# Patient Record
Sex: Male | Born: 1990 | Race: Black or African American | Hispanic: No | Marital: Single | Smoking: Never smoker
Health system: Southern US, Community
[De-identification: ages and names within clinical notes are randomized; demographics above are authoritative.]

## PROBLEM LIST (undated history)

## (undated) DIAGNOSIS — G8929 Other chronic pain: Secondary | ICD-10-CM

## (undated) DIAGNOSIS — M79672 Pain in left foot: Secondary | ICD-10-CM

## (undated) HISTORY — DX: Pain in left foot: M79.672

## (undated) HISTORY — PX: CYST EXCISION: SHX5701

## (undated) HISTORY — DX: Other chronic pain: G89.29

---

## 2017-09-15 ENCOUNTER — Encounter (HOSPITAL_BASED_OUTPATIENT_CLINIC_OR_DEPARTMENT_OTHER): Payer: Self-pay | Admitting: Emergency Medicine

## 2017-09-15 ENCOUNTER — Emergency Department (HOSPITAL_BASED_OUTPATIENT_CLINIC_OR_DEPARTMENT_OTHER)
Admission: EM | Admit: 2017-09-15 | Discharge: 2017-09-15 | Disposition: A | Discharge status: Home / Self Care | Payer: Self-pay | Attending: Emergency Medicine | Admitting: Emergency Medicine

## 2017-09-15 ENCOUNTER — Other Ambulatory Visit: Payer: Self-pay

## 2017-09-15 ENCOUNTER — Emergency Department (HOSPITAL_BASED_OUTPATIENT_CLINIC_OR_DEPARTMENT_OTHER): Payer: Self-pay

## 2017-09-15 DIAGNOSIS — R2242 Localized swelling, mass and lump, left lower limb: Secondary | ICD-10-CM | POA: Insufficient documentation

## 2017-09-15 DIAGNOSIS — M722 Plantar fascial fibromatosis: Secondary | ICD-10-CM | POA: Insufficient documentation

## 2017-09-15 MED ORDER — IBUPROFEN 800 MG PO TABS: 800.0000 mg | ORAL_TABLET | Freq: Three times a day (TID) | ORAL | 0 refills | Status: DC | PRN

## 2017-09-15 NOTE — ED Notes (Signed)
Pt and mother given d/c instructions as per chart. Rx x 1. Verbalizes understanding. No questions.

## 2017-09-15 NOTE — ED Triage Notes (Signed)
Pt reports LT ankle swelling x few days; no known injury

## 2017-09-15 NOTE — Discharge Instructions (Signed)
Return here as needed follow-up with specialist provided.  Ice the foot.

## 2017-09-15 NOTE — ED Provider Notes (Signed)
[facility] EMERGENCY DEPARTMENT Provider Note   CSN: [id number] Arrival date & time: 09/15/17  1349     History   Chief Complaint Chief Complaint  Patient presents with  . Joint Swelling    HPI Darryl Martinez is a 26 y.o. male.  HPI Patient presents to the emergency department with left heel pain with swelling along the foot and ankle.  The patient states that this started about 1 week ago he states that he does stand a lot with his job.  The patient states he did not take any medications prior to arrival the patient states that movement and palpation makes the pain worse.  The patient states that nothing seems to make the condition better.  Patient denies numbness weakness rash fever or leg swelling or syncope. History reviewed. No pertinent past medical history.  There are no active problems to display for this patient.   Past Surgical History:  Procedure Laterality Date  . CYST EXCISION         Home Medications    Prior to Admission medications   Not on File    Family History No family history on file.  Social History Social History   Tobacco Use  . Smoking status: Never Smoker  . Smokeless tobacco: Never Used  Substance Use Topics  . Alcohol use: Yes  . Drug use: Not on file     Allergies   Patient has no known allergies.   Review of Systems Review of Systems  All other systems negative except as documented in the HPI. All pertinent positives and negatives as reviewed in the HPI. Physical Exam Updated Vital Signs BP (!) 142/100 (BP Location: Left Arm)   Pulse 90   Temp 98.8 F (37.1 C) (Oral)   Resp 18   Ht 5\' 5"  (1.651 m)   Wt 90.7 kg (200 lb)   SpO2 100%   BMI 33.28 kg/m   Physical Exam  Constitutional: He is oriented to person, place, and time. He appears well-developed and well-nourished. No distress.  HENT:  Head: Normocephalic and atraumatic.  Eyes: Pupils are equal, round, and reactive to light.    Pulmonary/Chest: Effort normal.  Musculoskeletal:       Feet:  Neurological: He is alert and oriented to person, place, and time.  Skin: Skin is warm and dry.  Psychiatric: He has a normal mood and affect.  Nursing note and vitals reviewed.    ED Treatments / Results  Labs (all labs ordered are listed, but only abnormal results are displayed) Labs Reviewed - No data to display  EKG  EKG Interpretation None       Radiology Dg Foot Complete Left  Result Date: 09/15/2017 CLINICAL DATA:  Swelling and pain in left heel radiating to the ankle. Wore wrong shoes to work 1 week ago. Initial encounter. EXAM: LEFT FOOT - COMPLETE 3+ VIEW COMPARISON:  None. FINDINGS: There is no evidence of fracture or dislocation. There is no evidence of arthropathy or other focal bone abnormality. Soft tissues are unremarkable. IMPRESSION: Negative. Electronically Signed   By: Marnee SpringJonathon  Watts M.D.   On: 09/15/2017 15:00    Procedures Procedures (including critical care time)  Medications Ordered in ED Medications - No data to display   Initial Impression / Assessment and Plan / ED Course  I have reviewed the triage vital signs and the nursing notes.  Pertinent labs & imaging results that were available during my care of the patient were reviewed by me and  considered in my medical decision making (see chart for details).     Patient will be treated for plantar fasciitis referred to podiatry.  Patient is advised to do stretching in the morning along with ice told to stay off of his foot as much as possible.  Final Clinical Impressions(s) / ED Diagnoses   Final diagnoses:  None    ED Discharge Orders    None       Charlestine NightLawyer, Seger Jani, PA-C 09/15/17 1534    Shaune PollackIsaacs, Cameron, MD 09/16/17 [phone]

## 2018-03-31 ENCOUNTER — Ambulatory Visit (INDEPENDENT_AMBULATORY_CARE_PROVIDER_SITE_OTHER): Payer: Self-pay | Admitting: Medical

## 2018-03-31 ENCOUNTER — Encounter: Payer: Self-pay | Admitting: Medical

## 2018-03-31 ENCOUNTER — Other Ambulatory Visit (HOSPITAL_COMMUNITY)
Admission: RE | Admit: 2018-03-31 | Discharge: 2018-03-31 | Disposition: A | Discharge status: Home / Self Care | Payer: Self-pay | Source: Ambulatory Visit | Attending: Medical | Admitting: Medical

## 2018-03-31 VITALS — BP 147/93 | HR 71 | Temp 99.2°F | Resp 16 | Ht 64.7 in | Wt 219.2 lb

## 2018-03-31 DIAGNOSIS — R0981 Nasal congestion: Secondary | ICD-10-CM

## 2018-03-31 DIAGNOSIS — Z113 Encounter for screening for infections with a predominantly sexual mode of transmission: Secondary | ICD-10-CM

## 2018-03-31 DIAGNOSIS — R300 Dysuria: Secondary | ICD-10-CM

## 2018-03-31 DIAGNOSIS — R0982 Postnasal drip: Secondary | ICD-10-CM

## 2018-03-31 MED ORDER — DOXYCYCLINE HYCLATE 100 MG PO TABS: 100.0000 mg | ORAL_TABLET | Freq: Two times a day (BID) | ORAL | 0 refills | Status: DC

## 2018-03-31 MED ORDER — LEVOCETIRIZINE DIHYDROCHLORIDE 5 MG PO TABS: 5.0000 mg | ORAL_TABLET | Freq: Every evening | ORAL | 0 refills | Status: DC

## 2018-03-31 MED ORDER — FLUTICASONE PROPIONATE 50 MCG/ACT NA SUSP: 2.0000 | Freq: Every day | NASAL | 1 refills | Status: DC

## 2018-03-31 NOTE — Progress Notes (Signed)
Subjective:    Patient ID: Darryl Martinez, male    DOB: 09-02-1991, 27 y.o.   MRN: [id number]  HPI  Pt in for first time.   Here to establish.  Pt member of [employer]. Has 2005 [employer].  Pt cuts hair/Barber. Pt does push ups and curls, Admits needs to eat better. Drinks alcohol on weeks 1-2 beers. Smokes some marijuana about one joint. Single.  Pt states about 2 weeks ago had unprotected sex and he wants to make sure no std. Pt states some mild pain on urination. States maybe discharge but he is not sure. Pt is circumsised.  Pt states one week of nasal congestion and pnd. No fever, no chills, no sweats or sinus pain. Some pnd.   Review of Systems  Constitutional: Negative for chills, fatigue and fever.  Respiratory: Negative for cough, chest tightness, shortness of breath and wheezing.   Cardiovascular: Negative for chest pain and palpitations.  Genitourinary: Positive for dysuria. Negative for decreased urine volume, penile pain, testicular pain and urgency.  Musculoskeletal: Negative for back pain, joint swelling and myalgias.  Neurological: Negative for seizures, facial asymmetry, speech difficulty, weakness and headaches.  Hematological: Negative for adenopathy. Does not bruise/bleed easily.  Psychiatric/Behavioral: Negative for behavioral problems and confusion.    Past Medical History:  Diagnosis Date  . Heel pain, chronic, left      Social History   Socioeconomic History  . Marital status: Single    Spouse name: Not on file  . Number of children: Not on file  . Years of education: Not on file  . Highest education level: Not on file  Occupational History  . Occupation: Paediatric nurse  Social Needs  . Financial resource strain: Not hard at all  . Food insecurity:    Worry: Never true    Inability: Never true  . Transportation needs:    Medical: No    Non-medical: No  Tobacco Use  . Smoking status: Never Smoker  . Smokeless tobacco: Never Used  Substance  and Sexual Activity  . Alcohol use: Yes    Comment: once a month  . Drug use: Yes    Types: Marijuana    Comment: "sometimes"  . Sexual activity: Yes    Partners: Female  Lifestyle  . Physical activity:    Days per week: 2 days    Minutes per session: 20 min  . Stress: To some extent  Relationships  . Social connections:    Talks on phone: More than three times a week    Gets together: Twice a week    Attends religious service: Never    Active member of club or organization: Yes    Attends meetings of clubs or organizations: More than 4 times per year    Relationship status: Not on file  . Intimate partner violence:    Fear of current or ex partner: Not on file    Emotionally abused: Not on file    Physically abused: Not on file    Forced sexual activity: Not on file  Other Topics Concern  . Not on file  Social History Narrative  . Not on file    Past Surgical History:  Procedure Laterality Date  . CYST EXCISION      No family history on file.  No Known Allergies  Current Outpatient Medications on File Prior to Visit  Medication Sig Dispense Refill  . ibuprofen (ADVIL,MOTRIN) 800 MG tablet Take 1 tablet (800 mg total) every 8 (eight) hours  as needed by mouth. 30 tablet 0   No current facility-administered medications on file prior to visit.     BP (!) 147/93 (BP Location: Left Arm, Patient Position: Sitting, Cuff Size: Large)   Pulse 71   Temp 99.2 F (37.3 C) (Oral)   Resp 16   Ht 5' 4.7" (1.643 m)   Wt 219 lb 3.2 oz (99.4 kg)   SpO2 100%   BMI 36.82 kg/m       Objective:   Physical Exam  General  Mental Status - Alert. General Appearance - Well groomed. Not in acute distress.  Skin Rashes- No Rashes.  HEENT Head- Normal. Ear Auditory Canal - Left- Normal. Right - Normal.Tympanic Membrane- Left- Normal. Right- Normal. Eye Sclera/Conjunctiva- Left- Normal. Right- Normal. Nose & Sinuses Nasal Mucosa- Left-  Boggy and Congested. Right-  Boggy  and  Congested.Bilateral no maxillary and no  rontal sinus pressure. Mouth & Throat Lips: Upper Lip- Normal: no dryness, cracking, pallor, cyanosis, or vesicular eruption. Lower Lip-Normal: no dryness, cracking, pallor, cyanosis or vesicular eruption. Buccal Mucosa- Bilateral- No Aphthous ulcers. Oropharynx- No Discharge or Erythema. +pnd. Tonsils: Characteristics- Bilateral- No Erythema or Congestion. Size/Enlargement- Bilateral- No enlargement. Discharge- bilateral-None.  Neck Neck- Supple. No Masses.   Chest and Lung Exam Auscultation: Breath Sounds:-Clear even and unlabored.  Cardiovascular Auscultation:Rythm- Regular, rate and rhythm. Murmurs & Other Heart Sounds:Ausculatation of the heart reveal- No Murmurs.  Lymphatic Head & Neck General Head & Neck Lymphatics: Bilateral: Description- No Localized lymphadenopathy.  Genital exam- deferred.      Assessment & Plan:  For STD screening, I placed orders for HIV, RPR, herpes type I & type II antibody testing and urine ancillary studies.  Since you do have some dysuria and symptoms occur around the time of unprotected sex, I decided to prescribe you doxycycline antibiotic.  For probable allergic rhinitis symptoms represented by nasal congestion and postnasal drainage, I prescribed Flonase and Xyzal.  Please remember those are over-the-counter.  Follow-up in 7 to 10 days or as needed.  Reminded pt on his concrn for std that hiv could repeat 3-6 months later for his reassurance as he does express concern. Explained how antibody test work.  Esperanza RichtersEdward Roniel Halloran, PA-C

## 2018-03-31 NOTE — Patient Instructions (Addendum)
For STD screening, I placed orders for HIV, RPR, herpes type I & type II antibody testing and urine ancillary studies.  Since you do have some dysuria and symptoms occur around the time of unprotected sex, I decided to prescribe you doxycycline antibiotic.   For probable allergic rhinitis symptoms represented by nasal congestion and postnasal drainage, I prescribed Flonase and Xyzal.  Please remember those are over-the-counter.  Follow-up in 7 to 10 days or as needed.

## 2018-04-01 ENCOUNTER — Telehealth: Payer: Self-pay | Admitting: Medical

## 2018-04-01 MED ORDER — FAMCICLOVIR 500 MG PO TABS: 500.0000 mg | ORAL_TABLET | Freq: Three times a day (TID) | ORAL | 0 refills | Status: DC

## 2018-04-01 NOTE — Telephone Encounter (Signed)
Rx famvir sent to pt pharmacy. 

## 2018-04-02 ENCOUNTER — Telehealth: Payer: Self-pay

## 2018-04-02 LAB — URINE CYTOLOGY ANCILLARY ONLY
Chlamydia: NEGATIVE
NEISSERIA GONORRHEA: POSITIVE — AB
TRICH (WINDOWPATH): NEGATIVE

## 2018-04-02 NOTE — Telephone Encounter (Signed)
Author phoned pt. 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 Pt. confirms that he has sores on his penis at this time, and stated he would pick up his prescription at the [facility] in [place] today.

## 2018-04-03 ENCOUNTER — Ambulatory Visit (INDEPENDENT_AMBULATORY_CARE_PROVIDER_SITE_OTHER): Payer: Self-pay | Admitting: Medical

## 2018-04-03 ENCOUNTER — Encounter: Payer: Self-pay | Admitting: Medical

## 2018-04-03 ENCOUNTER — Telehealth: Payer: Self-pay

## 2018-04-03 VITALS — BP 150/90 | HR 80 | Temp 98.2°F | Resp 18 | Ht 65.0 in | Wt 222.0 lb

## 2018-04-03 DIAGNOSIS — B009 Herpesviral infection, unspecified: Secondary | ICD-10-CM

## 2018-04-03 DIAGNOSIS — A549 Gonococcal infection, unspecified: Secondary | ICD-10-CM

## 2018-04-03 LAB — RPR: RPR Ser Ql: NONREACTIVE

## 2018-04-03 LAB — HSV 1/2 AB (IGM), IFA W/RFLX TITER
HSV 1 IgM Screen: NEGATIVE
HSV 2 IGM SCREEN: NEGATIVE

## 2018-04-03 LAB — HSV(HERPES SIMPLEX VRS) I + II AB-IGG: HSV 2 IGG, TYPE SPEC: 14.5 {index} — AB

## 2018-04-03 LAB — HIV ANTIBODY (ROUTINE TESTING W REFLEX): HIV 1&2 Ab, 4th Generation: NONREACTIVE

## 2018-04-03 MED ORDER — CEFTRIAXONE SODIUM 1 G IJ SOLR
1.0000 g | Freq: Once | INTRAMUSCULAR | Status: AC
  Administered 2018-04-03: 1 g via INTRAMUSCULAR

## 2018-04-03 NOTE — Telephone Encounter (Signed)
Author phoned pt. to notify him of positive gonnorrhea, and to request pt. come into the office for treatment per Esperanza RichtersEdward Saguier, PA. Appointment made for 6/6 at 0940 today.

## 2018-04-03 NOTE — Patient Instructions (Signed)
For your recent positive gonorrhea urine study, we gave you Rocephin IM in the office.  Your chlamydia test came back negative so you can stop doxycycline.  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  No recent/current outbreak.  I did go ahead and send Famvir to your pharmacy to take early on in the event early flare.  Also we discussed preventative use of antiviral.  You declined this today due to the fact that you describe rare outbreak.  You already notified recent sexual contact.  She is aware that she should be treated.  Follow-up as needed.

## 2018-04-03 NOTE — Progress Notes (Signed)
Pt here for STD treatment and education. Pt c/o genital sores and pain. Last sexual contact two weeks ago, one partner per pt. report. Author advised pt. To notify partner so she can be treated. Edward to assess and educate.  Rocephin 1G given, pt. Tolerated well.

## 2018-04-03 NOTE — Progress Notes (Signed)
Subjective:    Patient ID: Darryl Martinez, male    DOB: [birth year], 27 y.o.   MRN: [id number]  HPI  Pt in for follow up. He had +hsv igG antibodies. No recent outbreak. Does not report chronic outbreaks in past.   Also he had + urine test for gonorrhea.   Recent sex with partner 2 weeks ago. See last visit.   Review of Systems  Constitutional: Negative for chills, fatigue and fever.  Respiratory: Negative for cough, chest tightness and wheezing.   Cardiovascular: Negative for chest pain and palpitations.  Gastrointestinal: Negative for abdominal pain.  Genitourinary: Negative for dysuria, flank pain, frequency, genital sores, hematuria, penile pain, scrotal swelling, testicular pain and urgency.       See hpi. Mild dysuria.  Musculoskeletal: Negative for back pain.  Skin: Negative for rash.  Neurological: Negative for dizziness, speech difficulty, numbness and headaches.  Hematological: Negative for adenopathy. Does not bruise/bleed easily.  Psychiatric/Behavioral: Negative for behavioral problems and confusion. The patient is not nervous/anxious.    Past Medical History:  Diagnosis Date  . Heel pain, chronic, left      Social History   Socioeconomic History  . Marital status: Single    Spouse name: Not on file  . Number of children: Not on file  . Years of education: Not on file  . Highest education level: Not on file  Occupational History  . Occupation: Paediatric nursebarber  Social Needs  . Financial resource strain: Not hard at all  . Food insecurity:    Worry: Never true    Inability: Never true  . Transportation needs:    Medical: No    Non-medical: No  Tobacco Use  . Smoking status: Never Smoker  . Smokeless tobacco: Never Used  Substance and Sexual Activity  . Alcohol use: Yes    Comment: once a month  . Drug use: Yes    Types: Marijuana    Comment: "sometimes"  . Sexual activity: Yes    Partners: Female  Lifestyle  . Physical activity:    Days per week:  2 days    Minutes per session: 20 min  . Stress: To some extent  Relationships  . Social connections:    Talks on phone: More than three times a week    Gets together: Twice a week    Attends religious service: Never    Active member of club or organization: Yes    Attends meetings of clubs or organizations: More than 4 times per year    Relationship status: Not on file  . Intimate partner violence:    Fear of current or ex partner: Not on file    Emotionally abused: Not on file    Physically abused: Not on file    Forced sexual activity: Not on file  Other Topics Concern  . Not on file  Social History Narrative  . Not on file    Past Surgical History:  Procedure Laterality Date  . CYST EXCISION      No family history on file.  No Known Allergies  No current outpatient medications on file prior to visit.   No current facility-administered medications on file prior to visit.     BP (!) 150/90 (BP Location: Left Arm, Patient Position: Sitting, Cuff Size: Large)   Pulse 80   Temp 98.2 F (36.8 C) (Oral)   Resp 18   Ht 5\' 5"  (1.651 m)   Wt 222 lb (100.7 kg)   SpO2 97%  BMI 36.94 kg/m       Objective:   Physical Exam   General- No acute distress. Pleasant patient. Neck- Full range of motion, no jvd Lungs- Clear, even and unlabored. Heart- regular rate and rhythm. Neurologic- CNII- XII grossly intact.  Genital exam- deferred today.     Assessment & Plan:  For your recent positive gonorrhea urine study, we gave you Rocephin IM in the office.  Your chlamydia test came back negative so you can stop doxycycline.  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  No recent/current outbreak.  I did go ahead and send Famvir to your pharmacy to take early on in the event early flare.  Also we discussed preventative use of antiviral.  You declined this today due to the fact that you describe rare outbreak.  You already notified recent sexual contact.  She is  aware that she should be treated.  Follow-up as needed.  Esperanza Richters, PA-C

## 2018-07-02 IMAGING — CR DG FOOT COMPLETE 3+V*L*
3 series · 3 of 3 positions shown · non-contrast
Comparison: None.

CLINICAL DATA: Swelling and pain in left heel radiating to the
ankle. Wore wrong shoes to work 1 week ago. Initial encounter.

EXAM:
LEFT FOOT - COMPLETE 3+ VIEW

[t foot ap left]
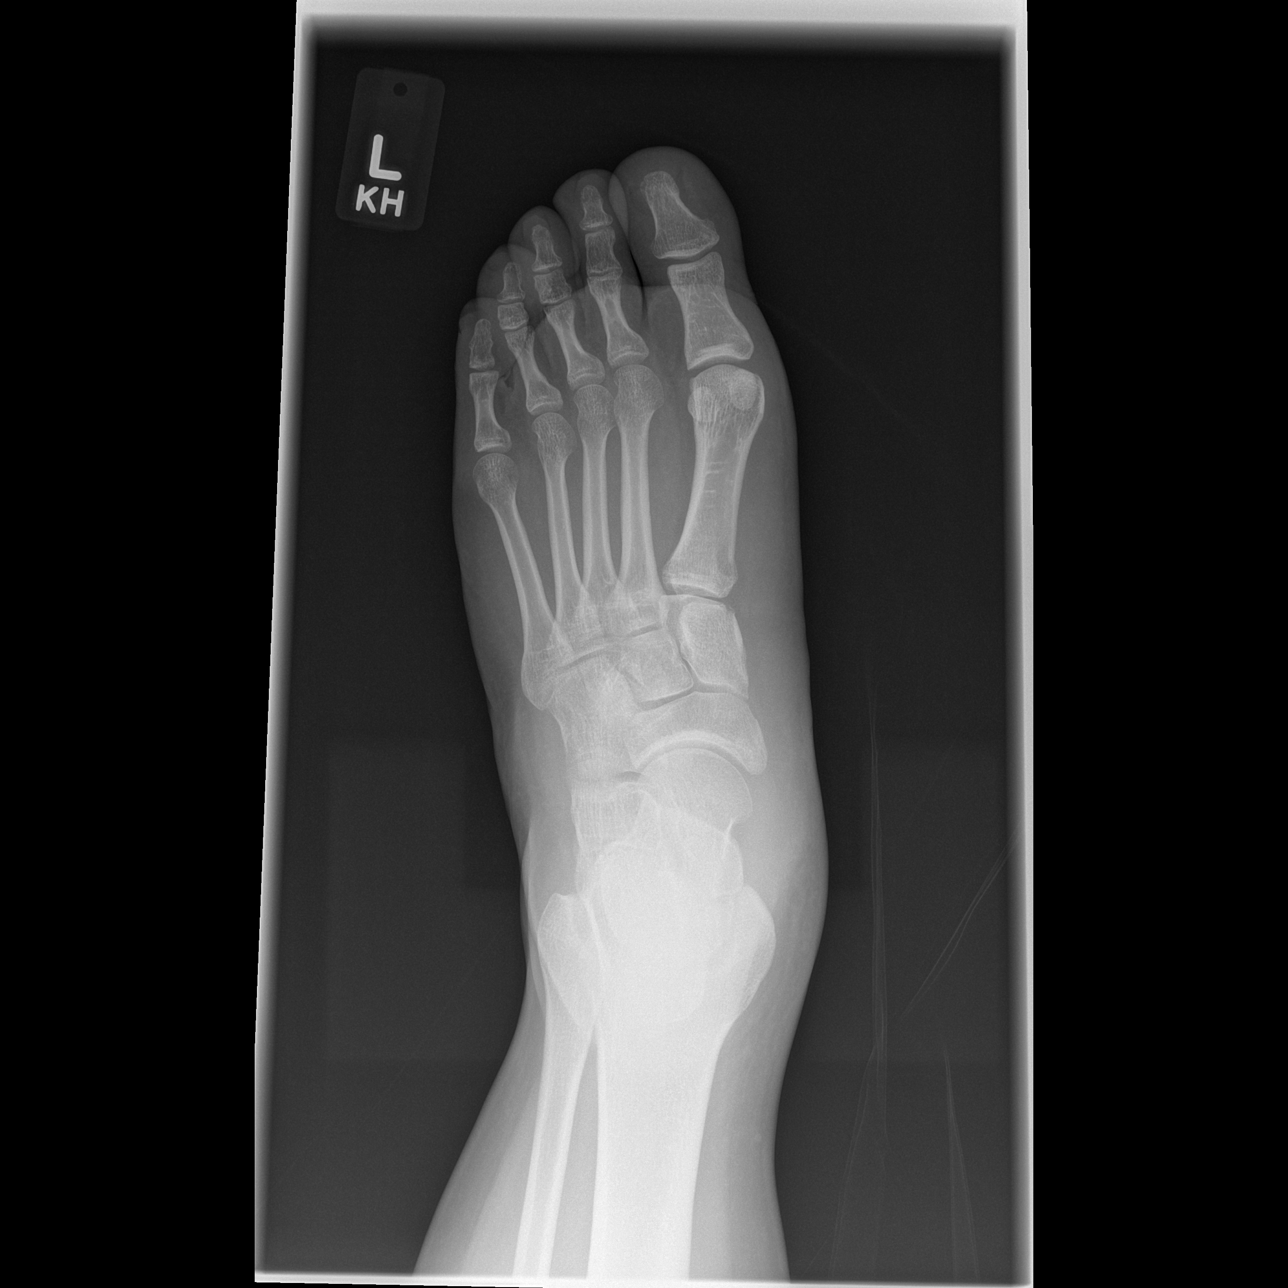

[t foot oblique left]
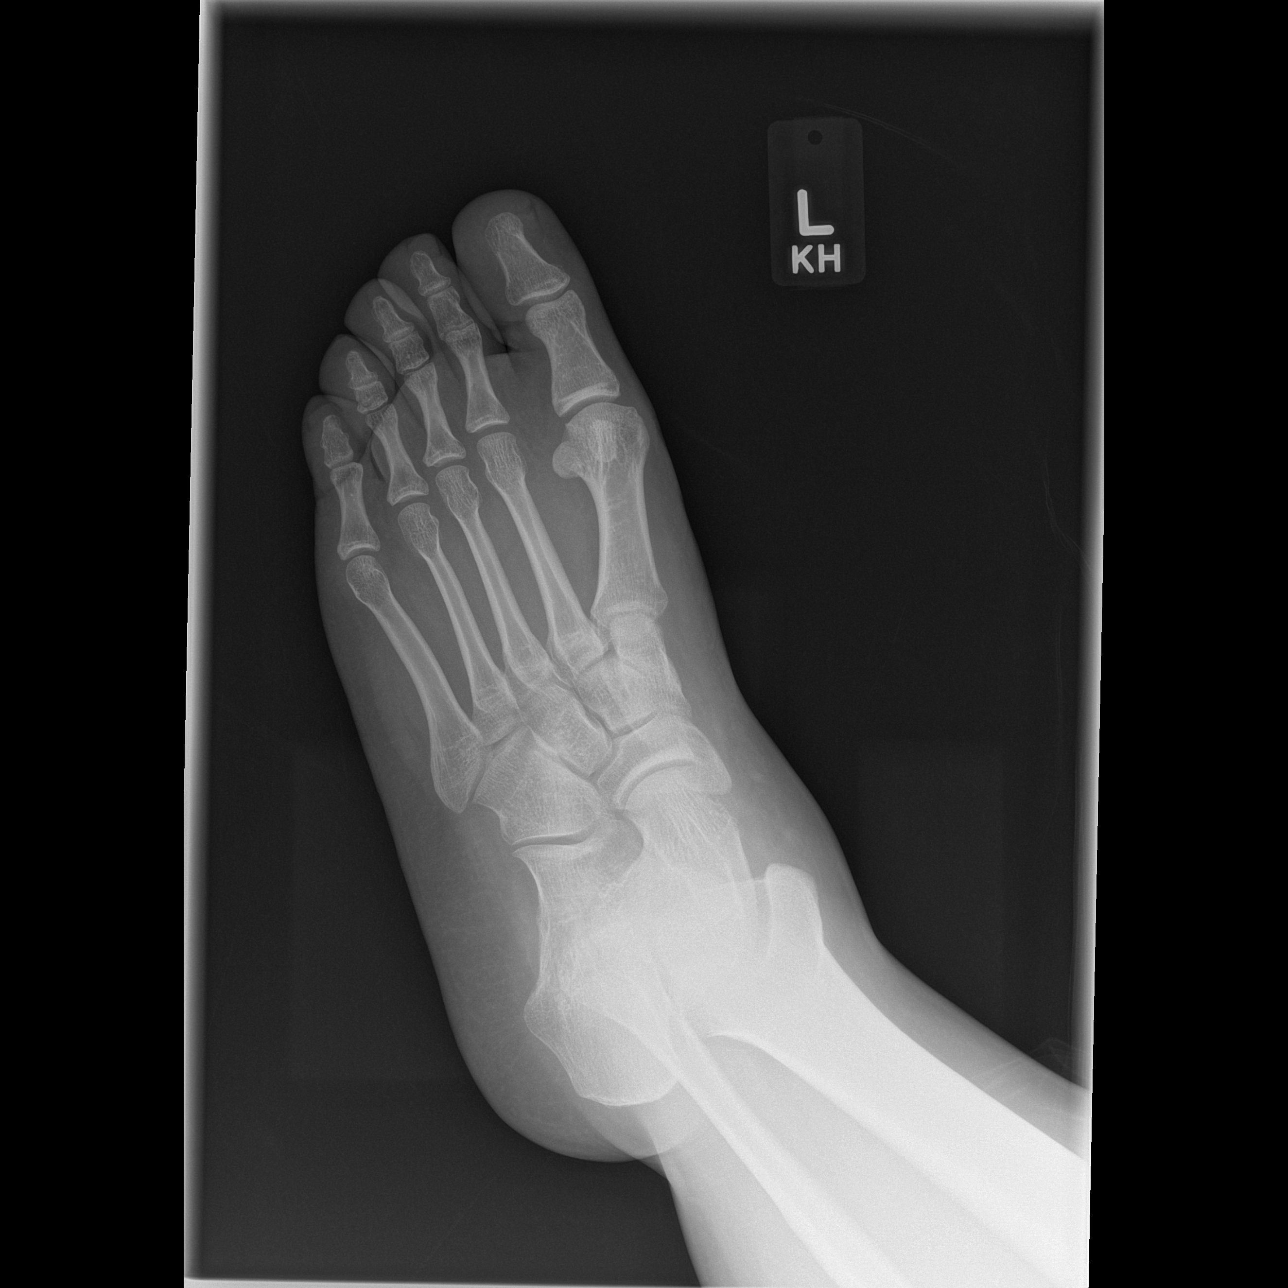

[t foot lat left]
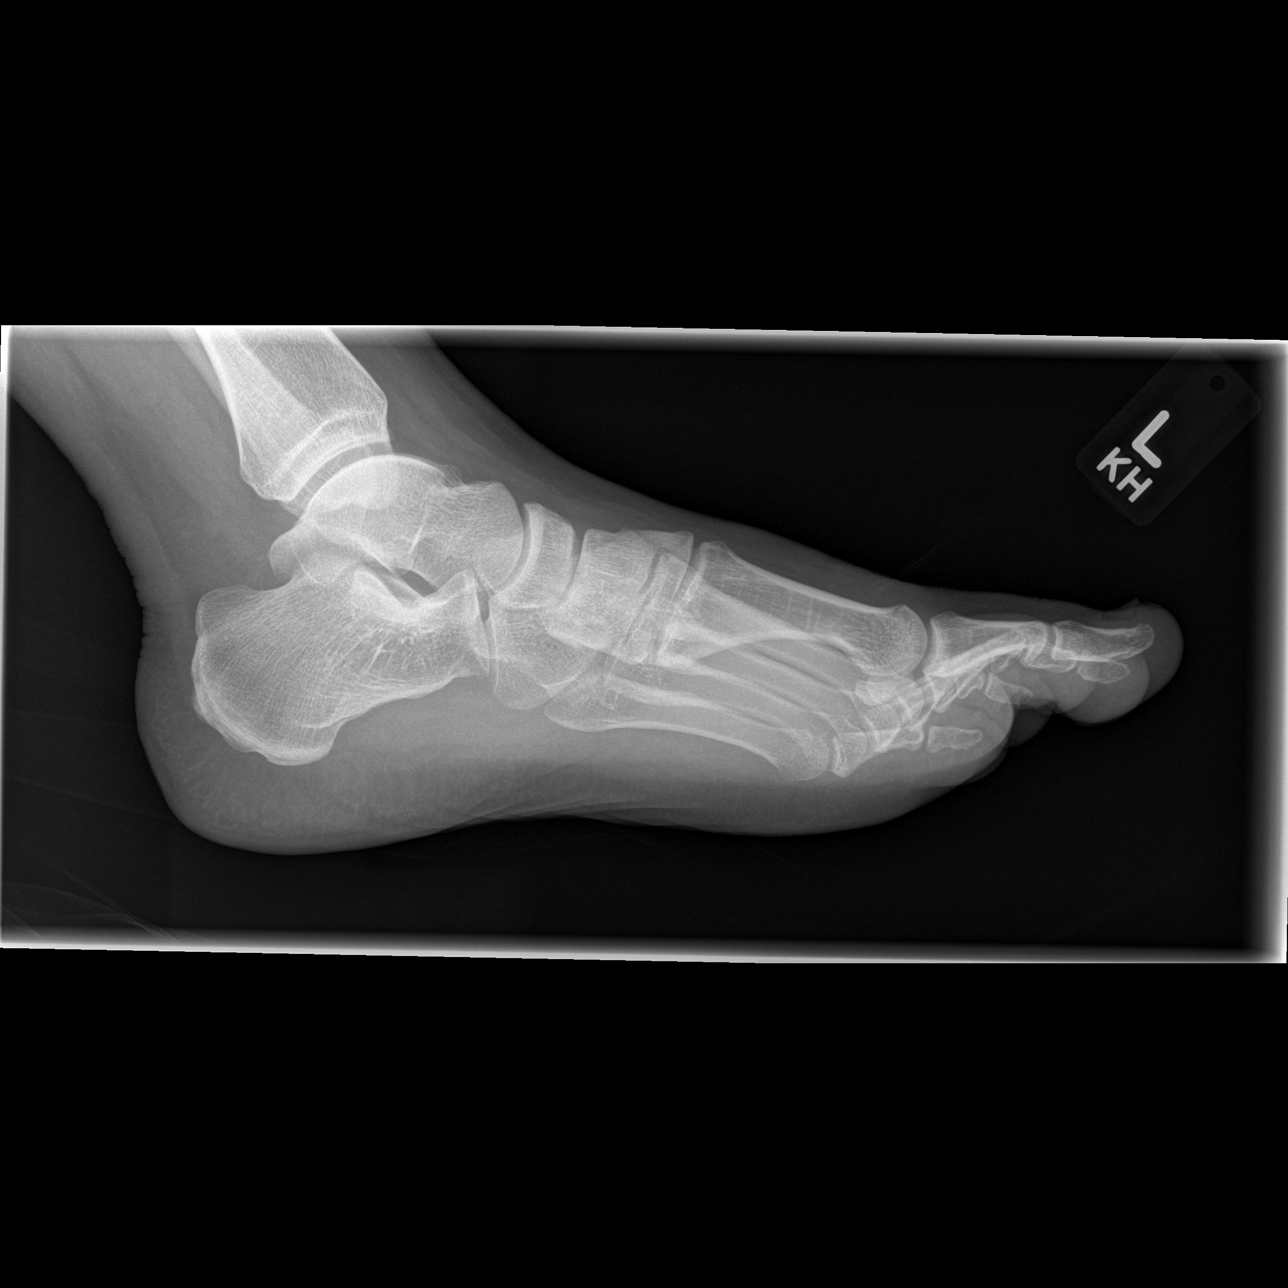

[3 of 3 positions shown; findings below may reference images not displayed]

FINDINGS: There is no evidence of fracture or dislocation. There is no
evidence of arthropathy or other focal bone abnormality. Soft
tissues are unremarkable.
IMPRESSION: Negative.
